# Patient Record
Sex: Female | Born: 1985 | Race: Black or African American | Hispanic: No | Marital: Single | Smoking: Current every day smoker
Health system: Southern US, Community
[De-identification: ages and names within clinical notes are randomized; demographics above are authoritative.]

## PROBLEM LIST (undated history)

## (undated) DIAGNOSIS — O24419 Gestational diabetes mellitus in pregnancy, unspecified control: Secondary | ICD-10-CM

## (undated) DIAGNOSIS — O169 Unspecified maternal hypertension, unspecified trimester: Secondary | ICD-10-CM

## (undated) DIAGNOSIS — J45909 Unspecified asthma, uncomplicated: Secondary | ICD-10-CM

---

## 2018-03-07 ENCOUNTER — Emergency Department (HOSPITAL_COMMUNITY)
Admission: EM | Admit: 2018-03-07 | Discharge: 2018-03-07 | Disposition: A | Discharge status: Home / Self Care | Payer: Managed Care, Other (non HMO) | Attending: Emergency Medicine | Admitting: Emergency Medicine

## 2018-03-07 ENCOUNTER — Other Ambulatory Visit: Payer: Self-pay

## 2018-03-07 ENCOUNTER — Emergency Department (HOSPITAL_COMMUNITY): Payer: Managed Care, Other (non HMO)

## 2018-03-07 ENCOUNTER — Encounter (HOSPITAL_COMMUNITY): Payer: Self-pay

## 2018-03-07 DIAGNOSIS — Y939 Activity, unspecified: Secondary | ICD-10-CM | POA: Diagnosis not present

## 2018-03-07 DIAGNOSIS — Y999 Unspecified external cause status: Secondary | ICD-10-CM | POA: Diagnosis not present

## 2018-03-07 DIAGNOSIS — J45909 Unspecified asthma, uncomplicated: Secondary | ICD-10-CM | POA: Diagnosis not present

## 2018-03-07 DIAGNOSIS — S3992XA Unspecified injury of lower back, initial encounter: Secondary | ICD-10-CM | POA: Diagnosis present

## 2018-03-07 DIAGNOSIS — F17210 Nicotine dependence, cigarettes, uncomplicated: Secondary | ICD-10-CM | POA: Insufficient documentation

## 2018-03-07 DIAGNOSIS — X58XXXA Exposure to other specified factors, initial encounter: Secondary | ICD-10-CM | POA: Insufficient documentation

## 2018-03-07 DIAGNOSIS — M5187 Other intervertebral disc disorders, lumbosacral region: Secondary | ICD-10-CM | POA: Insufficient documentation

## 2018-03-07 DIAGNOSIS — M519 Unspecified thoracic, thoracolumbar and lumbosacral intervertebral disc disorder: Secondary | ICD-10-CM

## 2018-03-07 DIAGNOSIS — S39012A Strain of muscle, fascia and tendon of lower back, initial encounter: Secondary | ICD-10-CM | POA: Insufficient documentation

## 2018-03-07 DIAGNOSIS — Y929 Unspecified place or not applicable: Secondary | ICD-10-CM | POA: Diagnosis not present

## 2018-03-07 HISTORY — DX: Unspecified maternal hypertension, unspecified trimester: O16.9

## 2018-03-07 HISTORY — DX: Unspecified asthma, uncomplicated: J45.909

## 2018-03-07 HISTORY — DX: Gestational diabetes mellitus in pregnancy, unspecified control: O24.419

## 2018-03-07 LAB — URINALYSIS, ROUTINE W REFLEX MICROSCOPIC
Bilirubin Urine: NEGATIVE
Glucose, UA: NEGATIVE mg/dL
Hgb urine dipstick: NEGATIVE
KETONES UR: NEGATIVE mg/dL
Nitrite: NEGATIVE
PH: 6 (ref 5.0–8.0)
Protein, ur: NEGATIVE mg/dL
SPECIFIC GRAVITY, URINE: 1.028 (ref 1.005–1.030)

## 2018-03-07 LAB — POC URINE PREG, ED: PREG TEST UR: NEGATIVE

## 2018-03-07 MED ORDER — DEXAMETHASONE SODIUM PHOSPHATE 10 MG/ML IJ SOLN
10.0000 mg | Freq: Once | INTRAMUSCULAR | Status: AC
  Administered 2018-03-07: 10 mg via INTRAMUSCULAR
  Filled 2018-03-07: qty 1

## 2018-03-07 MED ORDER — MELOXICAM 15 MG PO TABS: ORAL_TABLET | ORAL | 0 refills | Status: AC

## 2018-03-07 MED ORDER — METHYLPREDNISOLONE 4 MG PO TBPK: ORAL_TABLET | ORAL | 0 refills | Status: AC

## 2018-03-07 MED ORDER — CYCLOBENZAPRINE HCL 10 MG PO TABS: 5.0000 mg | ORAL_TABLET | Freq: Two times a day (BID) | ORAL | 0 refills | Status: AC | PRN

## 2018-03-07 MED ORDER — KETOROLAC TROMETHAMINE 60 MG/2ML IM SOLN
60.0000 mg | Freq: Once | INTRAMUSCULAR | Status: AC
  Administered 2018-03-07: 60 mg via INTRAMUSCULAR
  Filled 2018-03-07: qty 2

## 2018-03-07 NOTE — ED Provider Notes (Signed)
[facility]-EMERGENCY DEPT Provider Note   CSN: [id number] Arrival date & time: 03/07/18  1831     History   Chief Complaint Chief Complaint  Patient presents with  . Back Pain    HPI Veronica Guerrero is a 32 y.o. female.  Who presents the emergency department chief complaint of low back pain.  Patient states that her back pain began about 4 years ago after she gave birth to her child.  She has had intermittent bouts of severe low back pain that seems to shift from side to side is worse on the left worse with movement and standing upright or trying to change position.  She states that it radiates into her left buttock buttocks and at times her left leg will get a sharp electrical pain and give out on her.  Previously her symptoms have only lasted for couple days.  The pain she is having currently has been lasting for almost 4 days and her coworkers encouraged her to come in for evaluation in the emergency department tonight.  The patient denies saddle anesthesia, numbness, loss of control of bowel or bladder.  Her weakness is intermittent and does not seem to be provoked by any specific issue except for standing for long periods of time.    HPI  Past Medical History:  Diagnosis Date  . Asthma   . Gestational diabetes   . Hypertension complicating pregnancy     There are no active problems to display for this patient.   Past Surgical History:  Procedure Laterality Date  . CESAREAN SECTION     X1     OB History   None      Home Medications    Prior to Admission medications   Medication Sig Start Date End Date Taking? Authorizing Provider  naproxen sodium (ALEVE) 220 MG tablet Take 220 mg by mouth daily as needed (pain).   Yes [provider]    Family History History reviewed. No pertinent family history.  Social History Social History   Tobacco Use  . Smoking status: Current Every Day Smoker    Packs/day: 0.50    Types: Cigarettes    . Smokeless tobacco: Never Used  Substance Use Topics  . Alcohol use: Yes    Comment: OCCASIONAL  . Drug use: Never     Allergies   Kiwi extract   Review of Systems Review of Systems Ten systems reviewed and are negative for acute change, except as noted in the HPI.    Physical Exam Updated Vital Signs BP (!) 145/98   Pulse 95   Temp 98.8 F (37.1 C) (Oral)   Resp 18   Ht 5\' 9"  (1.753 m)   Wt 120.2 kg (265 lb)   LMP 01/14/2018 (Exact Date)   SpO2 99%   BMI 39.13 kg/m   Physical Exam  Constitutional: She is oriented to person, place, and time. She appears well-developed and well-nourished. No distress.  Patient appears uncomfortable.  She is sitting strongly toward the left side in examining chair   HENT:  Head: Normocephalic and atraumatic.  Eyes: Conjunctivae are normal. No scleral icterus.  Neck: Normal range of motion.  Cardiovascular: Normal rate, regular rhythm and normal heart sounds. Exam reveals no gallop and no friction rub.  No murmur heard. Pulmonary/Chest: Effort normal and breath sounds normal. No respiratory distress.  Abdominal: Soft. Bowel sounds are normal. She exhibits no distension and no mass. There is no tenderness. There is no guarding.  Musculoskeletal:  Patient has no midline back tenderness.  She is tender over the SI joints and sacrum.  Exquisitely tender to palpation in the left lumbar paraspinal region and the region of the left quadratus lumborum.  The patient moves in a guarded and extremely slow fashion.  She is unable to stand upright and stands with knees bent hunched over to about 70 degrees at the waist.  She is able to move very slowly but appears to be in severe pain.  She has no weakness  with flexion extension abduction or adduction of the hips, normal plantar flexion and dorsiflexion at the ankle.  Normal sensation and pulses  Neurological: She is alert and oriented to person, place, and time.  Skin: Skin is warm and dry. She is  not diaphoretic.  Psychiatric: Her behavior is normal.  Nursing note and vitals reviewed.    ED Treatments / Results  Labs (all labs ordered are listed, but only abnormal results are displayed) Labs Reviewed  URINALYSIS, ROUTINE W REFLEX MICROSCOPIC  POC URINE PREG, ED    EKG None  Radiology No results found.  Procedures Procedures (including critical care time)  Medications Ordered in ED Medications - No data to display   Initial Impression / Assessment and Plan / ED Course  I have reviewed the triage vital signs and the nursing notes.  Pertinent labs & imaging results that were available during my care of the patient were reviewed by me and considered in my medical decision making (see chart for details).     She with visible disc issues on the plain film of her lumbar region.  I personally reviewed these films and agree with the logic interpretation.  The patient will be given a shot of Decadron and Toradol here.  She will start a Medrol Dosepak in 48 hours and be given anti-inflammatories.  Patient may follow-up with neurosurgery.  Her recurrent and intermittent symptoms seem to be most consistent with a disc issue especially the electrical pain that going down the buttock.  Have discussed return precautions with the patient who appears appropriate for discharge at this time.  Final Clinical Impressions(s) / ED Diagnoses   Final diagnoses:  None    ED Discharge Orders    None       Arthor Captain, PA-C 03/07/18 2211    Terrilee Files, MD 03/08/18 [phone]

## 2018-03-07 NOTE — Discharge Instructions (Signed)
Get help right away if: °Your back pain is severe. °You are unable to stand or walk. °You develop pain in your legs. °You develop weakness in your buttocks or legs. °You have difficulty controlling when you urinate or when you have a bowel movement. °

## 2018-03-07 NOTE — ED Triage Notes (Signed)
PT C/O LOWER BACK X5 DAYS THAT IS PROGRESSIVELY GETTING WORSE. PT STS WHEN SHE WALKS, THE LEFT HIP WILL "GIVE-OUT". PT DENIES INJURY.

## 2019-06-29 IMAGING — CR DG SACRUM/COCCYX 2+V
3 series · 3 of 3 positions shown · non-contrast
Comparison: None.

CLINICAL DATA: Acute onset of lower back pain.

EXAM:
SACRUM AND COCCYX - 2+ VIEW

[t sacrum ap]
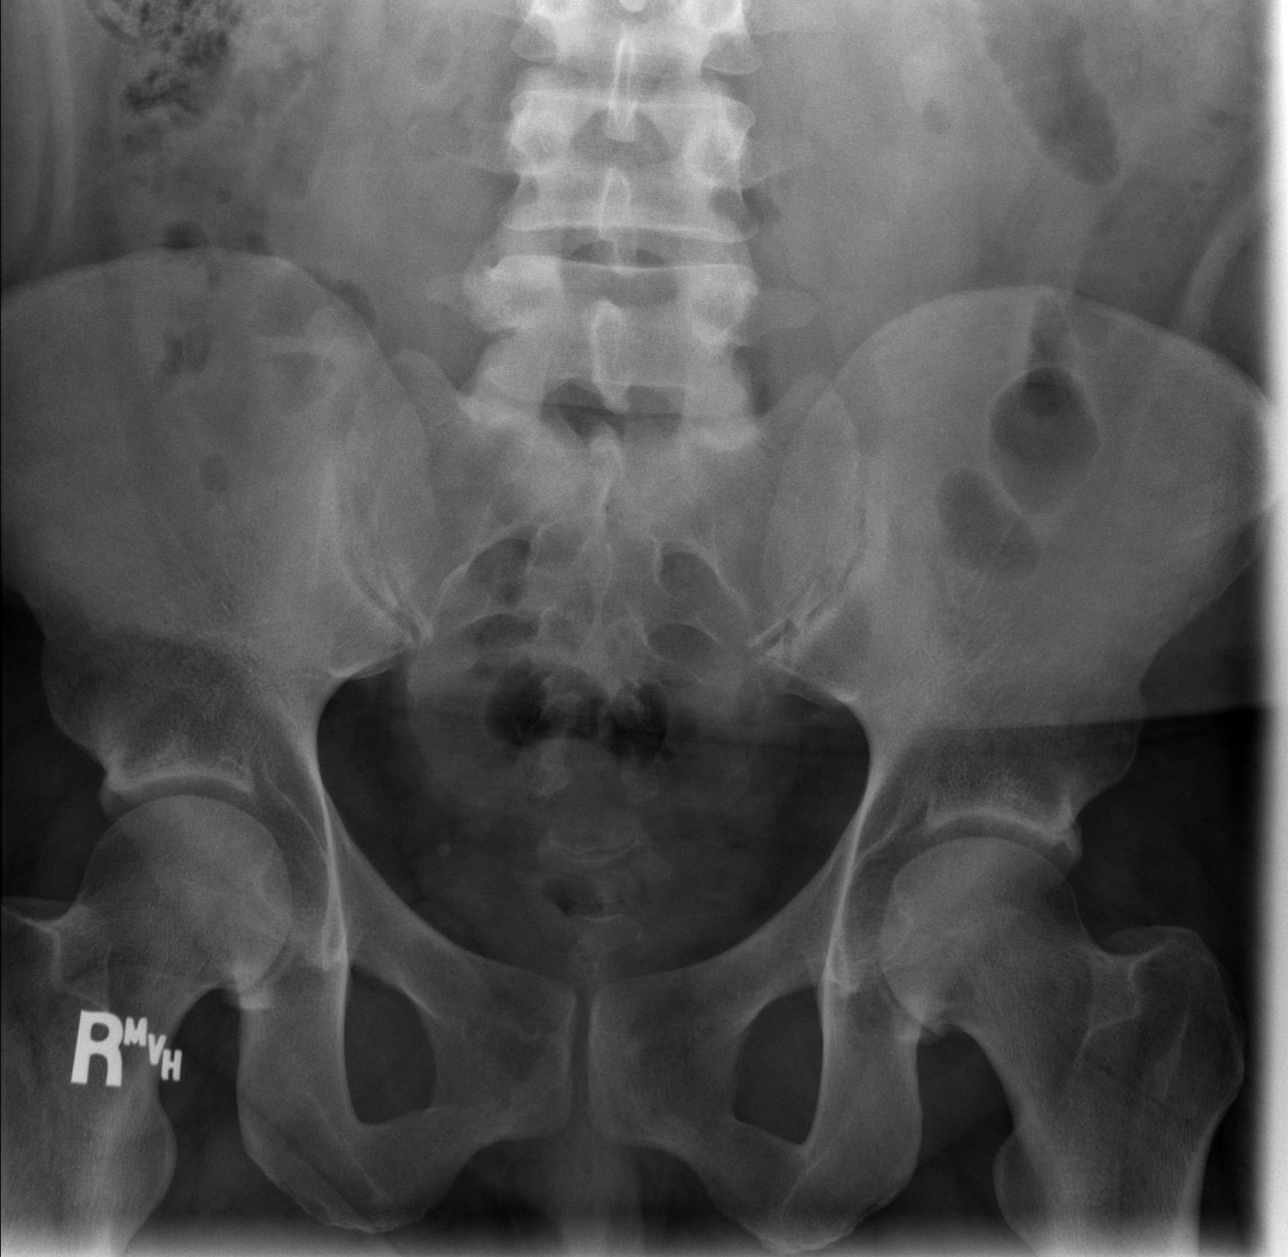

[t coccyx ap]
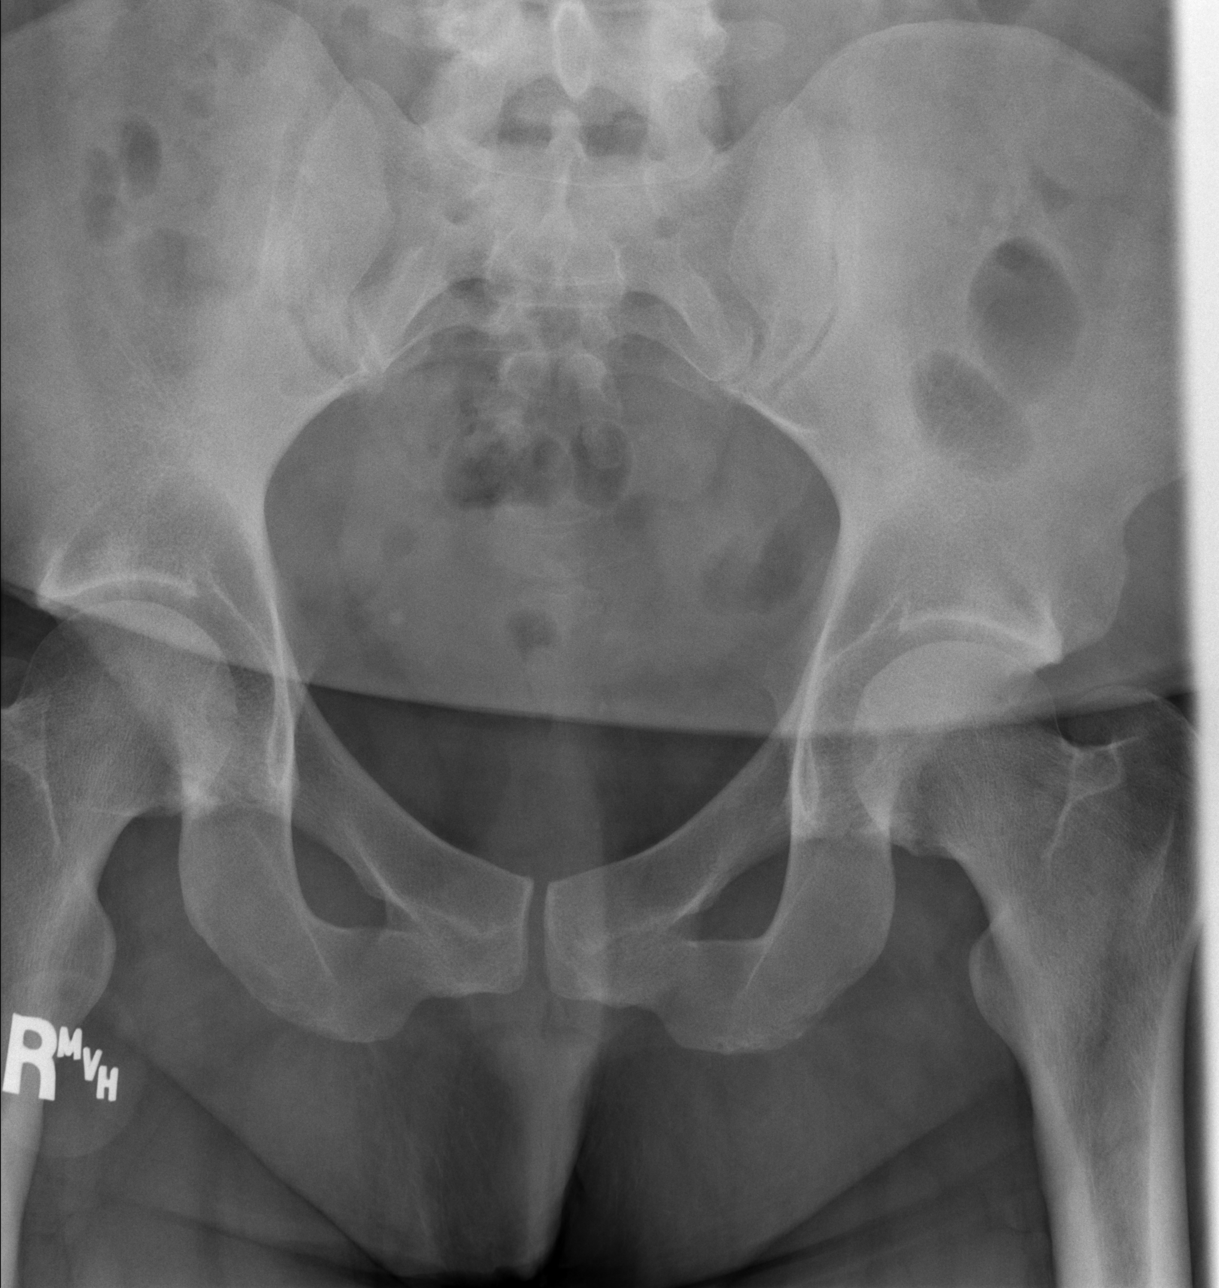

[t sacrum coccyx lat]
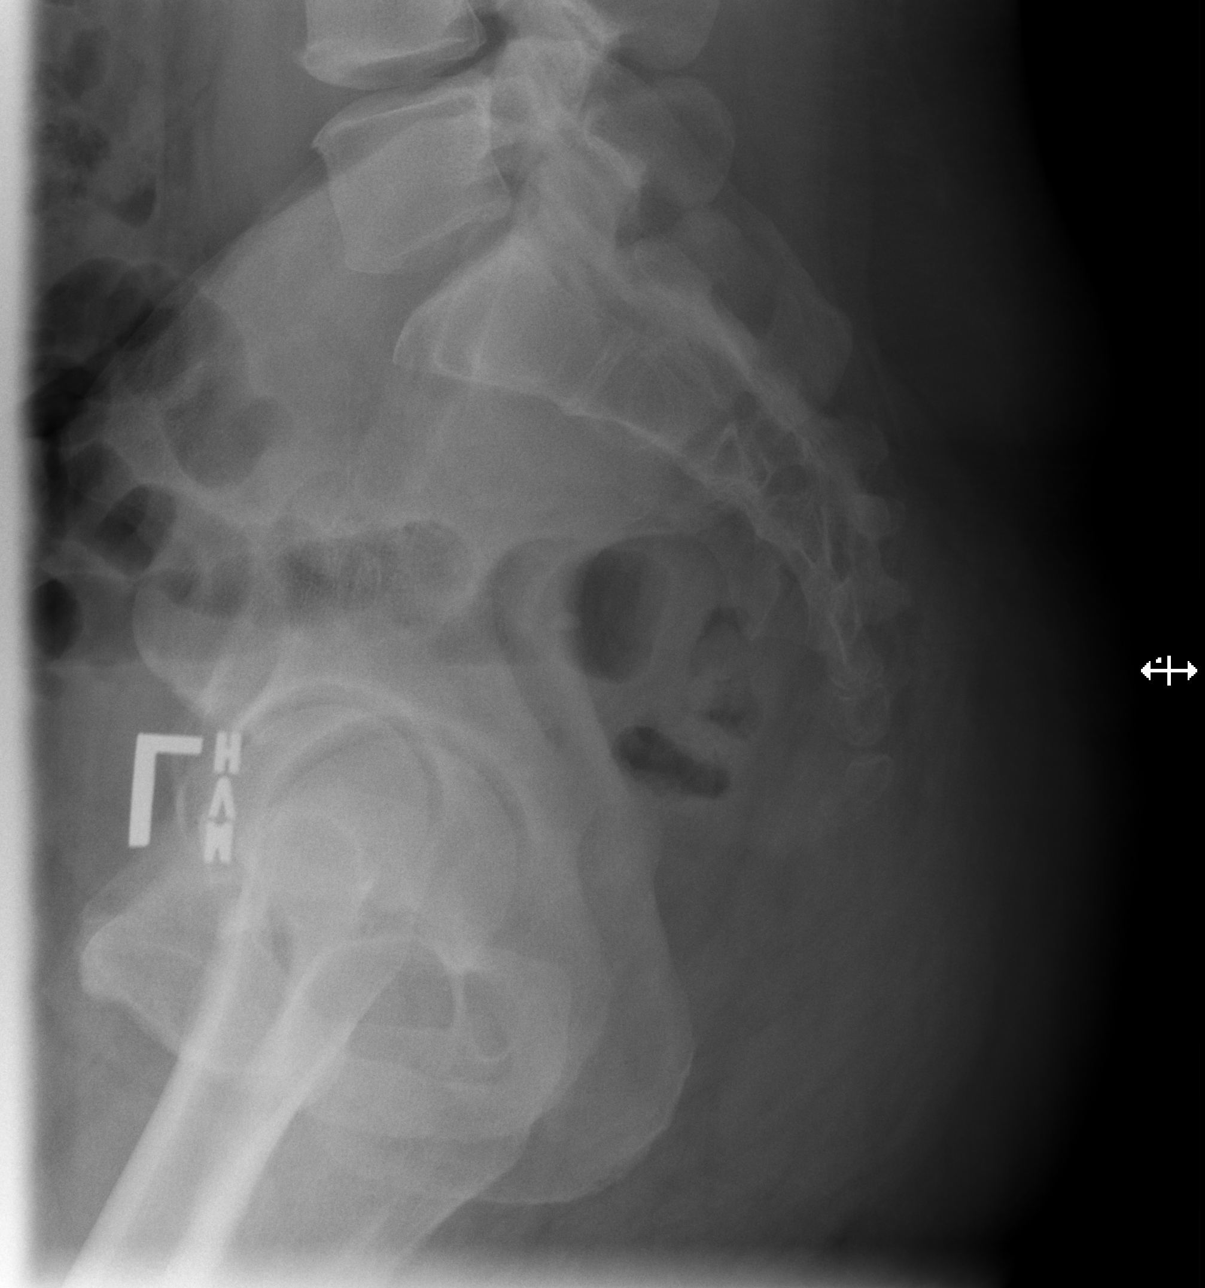

[3 of 3 positions shown; findings below may reference images not displayed]

FINDINGS: The sacrum and coccyx appear grossly intact. The sacroiliac joints
are grossly unremarkable. There is no evidence of fracture or
dislocation. The visualized bowel gas pattern is grossly
unremarkable.
IMPRESSION: No evidence of fracture or dislocation.

## 2019-06-29 IMAGING — CR DG LUMBAR SPINE COMPLETE 4+V
5 series · 5 of 5 positions shown · non-contrast
Comparison: None.

CLINICAL DATA: Increasing low back pain x5 days without injury left
greater than right.

EXAM:
LUMBAR SPINE - COMPLETE 4+ VIEW

[t lumbar spine ap]
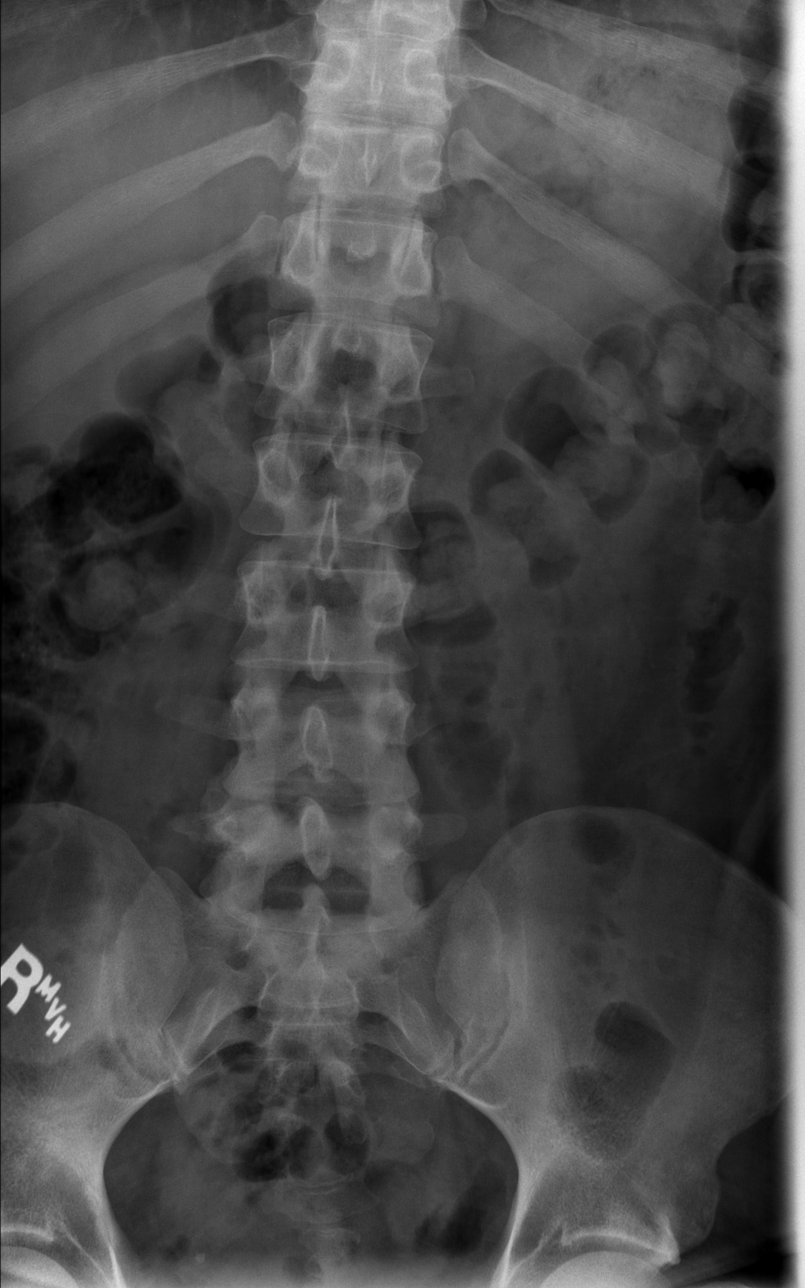

[t lumbar spine obl (1 of 2)]
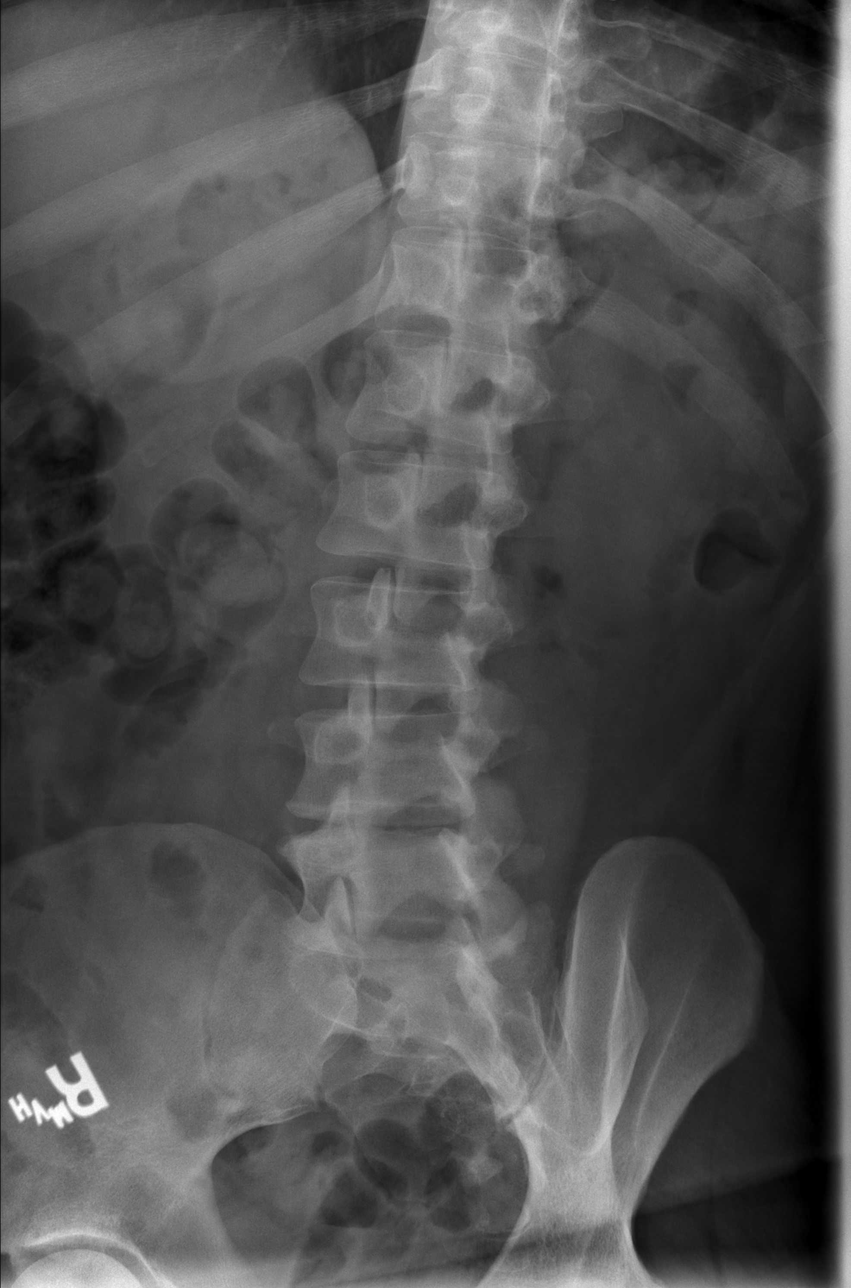

[t lumbar spine obl (2 of 2)]
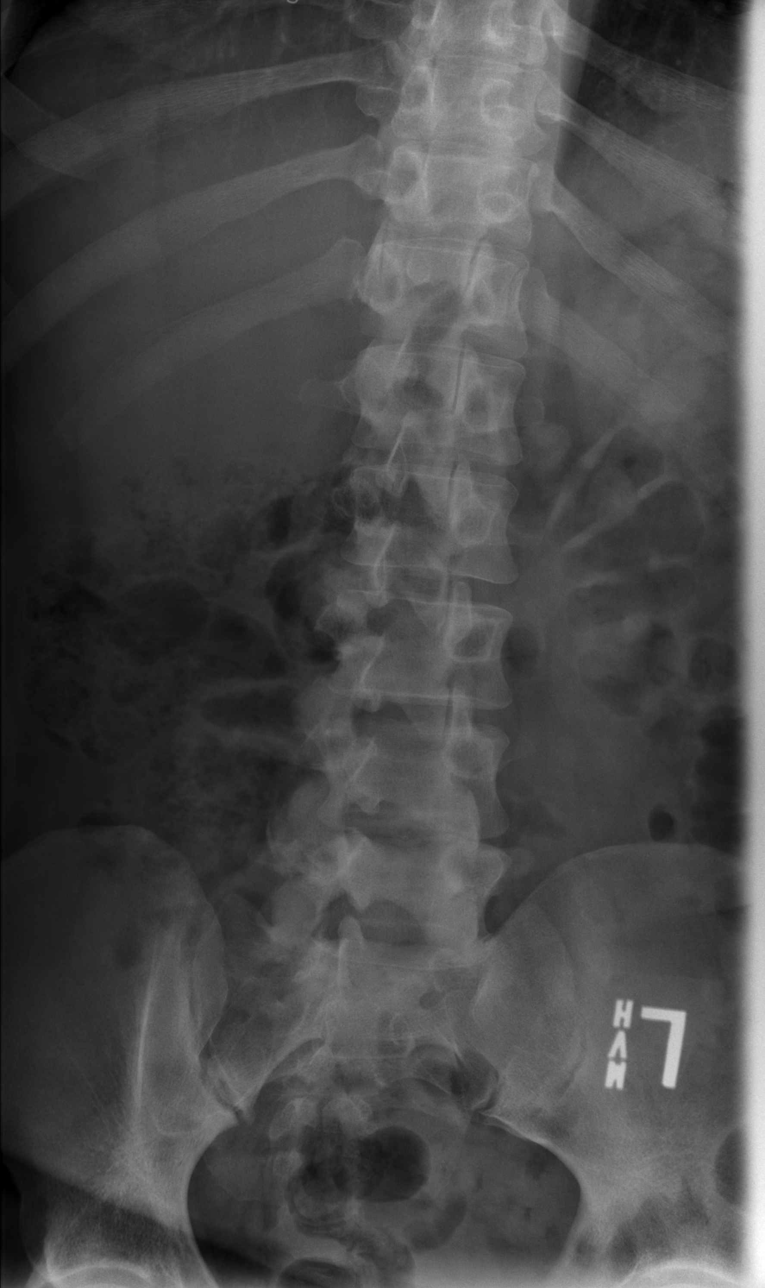

[t lumbar spine lat]
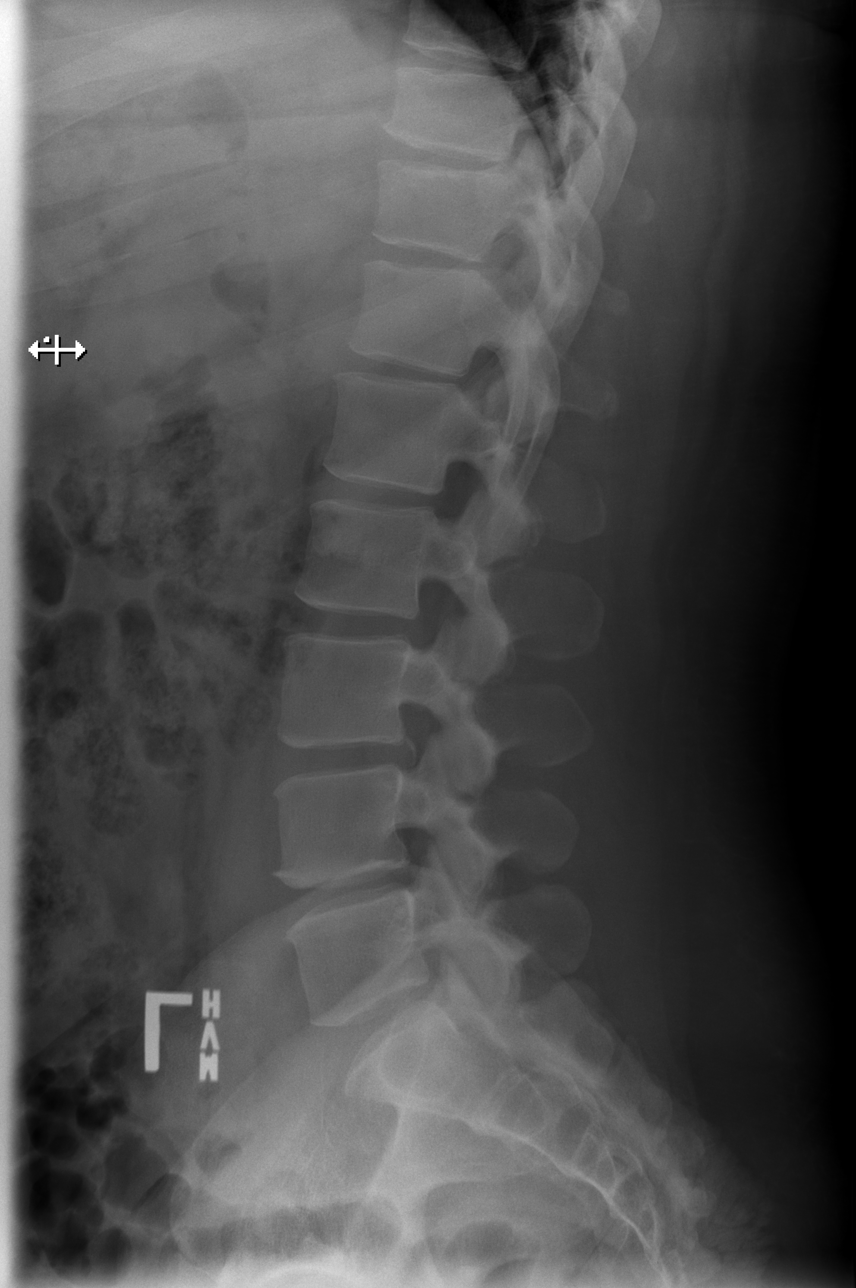

[t lumbar l-5 s-1 spot]
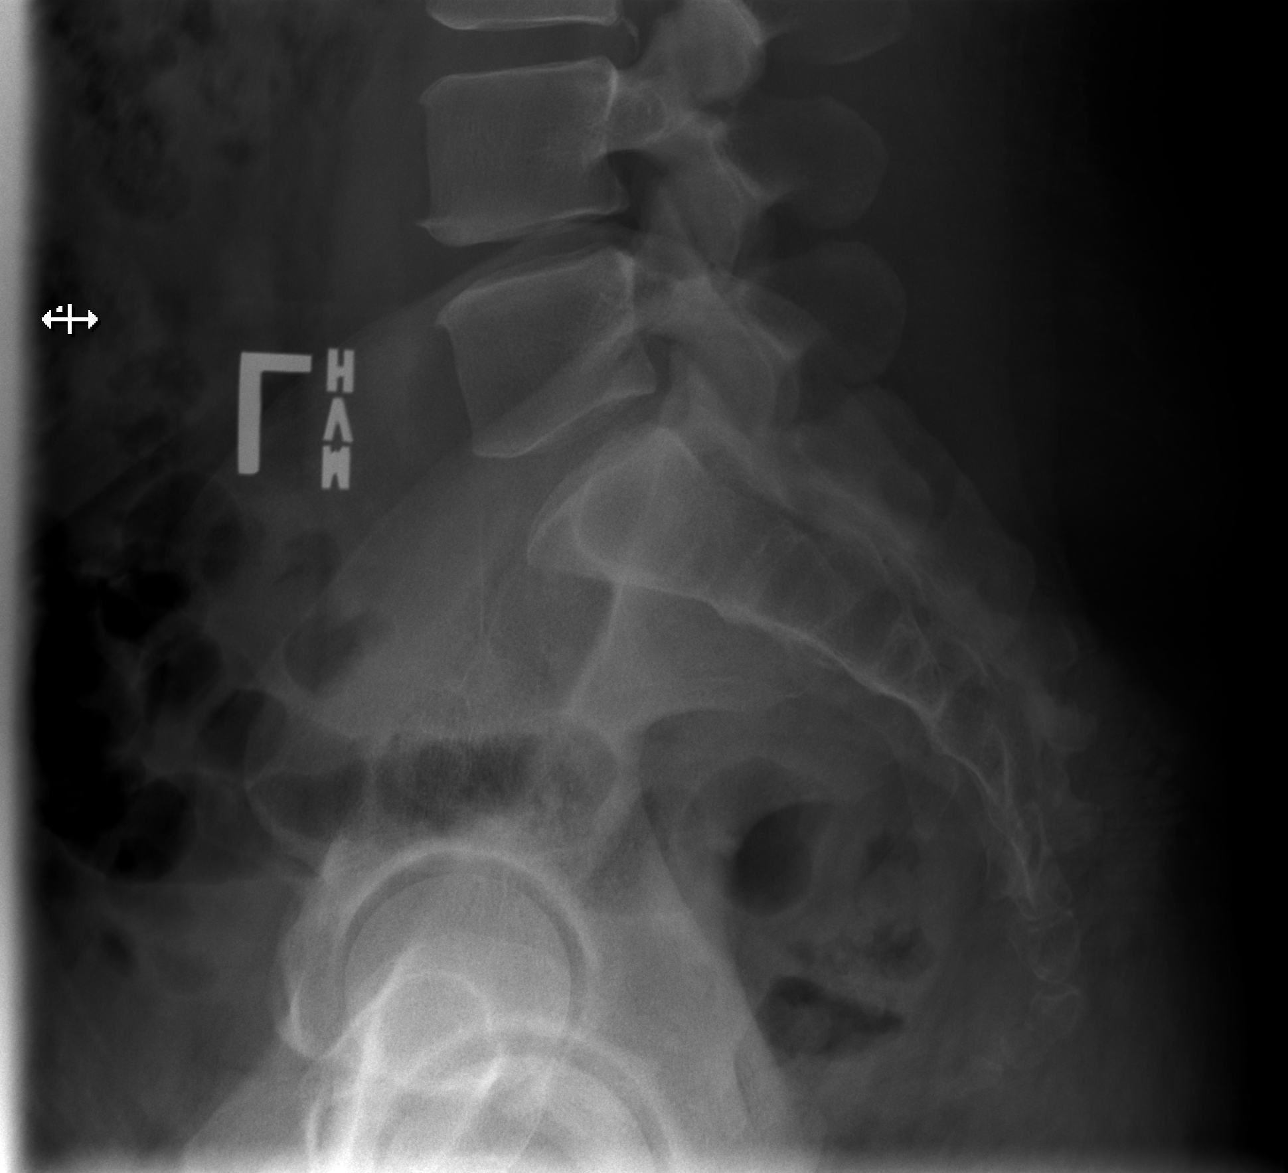

[5 of 5 positions shown; findings below may reference images not displayed]

FINDINGS: There is no evidence of lumbar spine fracture. Alignment is normal.
Partially calcified disc bulge is suggested at L3-4 with disc space
narrowing at L4-5. Mild facet joint space narrowing is seen at L4-5
and L5-S1 with minimal sclerosis. No acute fracture, pars defects or
listhesis. The sacroiliac joints are normal.
IMPRESSION: Partially calcified disc bulge is suggested at L3-4 with disc space
narrowing at L4-5. No acute osseous abnormality.
